# Patient Record
Sex: Female | Born: 1975 | Hispanic: No | Marital: Married | Smoking: Former smoker
Health system: Southern US, Community
[De-identification: ages and names within clinical notes are randomized; demographics above are authoritative.]

## PROBLEM LIST (undated history)

## (undated) DIAGNOSIS — I8390 Asymptomatic varicose veins of unspecified lower extremity: Secondary | ICD-10-CM

## (undated) DIAGNOSIS — D649 Anemia, unspecified: Secondary | ICD-10-CM

## (undated) DIAGNOSIS — R2240 Localized swelling, mass and lump, unspecified lower limb: Secondary | ICD-10-CM

## (undated) HISTORY — DX: Anemia, unspecified: D64.9

## (undated) HISTORY — PX: VEIN LIGATION: SHX2652

## (undated) HISTORY — DX: Localized swelling, mass and lump, unspecified lower limb: R22.40

## (undated) HISTORY — DX: Asymptomatic varicose veins of unspecified lower extremity: I83.90

---

## 2008-12-30 ENCOUNTER — Other Ambulatory Visit: Admission: RE | Admit: 2008-12-30 | Discharge: 2008-12-30 | Payer: Self-pay | Admitting: Family Medicine

## 2009-07-24 ENCOUNTER — Emergency Department (HOSPITAL_COMMUNITY): Admission: EM | Admit: 2009-07-24 | Discharge: 2009-07-24 | Payer: Self-pay | Admitting: Emergency Medicine

## 2010-10-14 ENCOUNTER — Inpatient Hospital Stay (INDEPENDENT_AMBULATORY_CARE_PROVIDER_SITE_OTHER)
Admission: RE | Admit: 2010-10-14 | Discharge: 2010-10-14 | Disposition: A | Discharge status: Home / Self Care | Payer: Self-pay | Source: Ambulatory Visit | Attending: Family Medicine | Admitting: Family Medicine

## 2010-10-14 DIAGNOSIS — N390 Urinary tract infection, site not specified: Secondary | ICD-10-CM

## 2010-10-14 LAB — POCT URINALYSIS DIPSTICK
Ketones, ur: NEGATIVE mg/dL
Protein, ur: NEGATIVE mg/dL
Specific Gravity, Urine: 1.015 (ref 1.005–1.030)
Urine Glucose, Fasting: NEGATIVE mg/dL
Urobilinogen, UA: 0.2 mg/dL (ref 0.0–1.0)

## 2010-10-14 LAB — WET PREP, GENITAL
Trich, Wet Prep: NONE SEEN
Yeast Wet Prep HPF POC: NONE SEEN

## 2010-11-23 LAB — POCT URINALYSIS DIP (DEVICE)
Bilirubin Urine: NEGATIVE
Ketones, ur: NEGATIVE mg/dL
Protein, ur: NEGATIVE mg/dL
pH: 5.5 (ref 5.0–8.0)

## 2011-12-20 ENCOUNTER — Other Ambulatory Visit (HOSPITAL_COMMUNITY)
Admission: RE | Admit: 2011-12-20 | Discharge: 2011-12-20 | Disposition: A | Discharge status: Home / Self Care | Payer: 59 | Source: Ambulatory Visit | Attending: Family Medicine | Admitting: Family Medicine

## 2011-12-20 DIAGNOSIS — Z124 Encounter for screening for malignant neoplasm of cervix: Secondary | ICD-10-CM | POA: Insufficient documentation

## 2012-01-09 ENCOUNTER — Encounter (INDEPENDENT_AMBULATORY_CARE_PROVIDER_SITE_OTHER): Payer: Self-pay

## 2012-01-25 ENCOUNTER — Ambulatory Visit (INDEPENDENT_AMBULATORY_CARE_PROVIDER_SITE_OTHER): Payer: 59 | Admitting: General Surgery

## 2012-02-08 ENCOUNTER — Encounter (INDEPENDENT_AMBULATORY_CARE_PROVIDER_SITE_OTHER): Payer: Self-pay | Admitting: General Surgery

## 2012-02-09 ENCOUNTER — Ambulatory Visit (INDEPENDENT_AMBULATORY_CARE_PROVIDER_SITE_OTHER): Payer: 59 | Admitting: General Surgery

## 2012-02-09 ENCOUNTER — Encounter (INDEPENDENT_AMBULATORY_CARE_PROVIDER_SITE_OTHER): Payer: Self-pay | Admitting: General Surgery

## 2012-02-09 VITALS — BP 116/66 | HR 62 | Temp 96.9°F | Resp 12 | Ht 65.75 in | Wt 138.5 lb

## 2012-02-09 DIAGNOSIS — I8390 Asymptomatic varicose veins of unspecified lower extremity: Secondary | ICD-10-CM | POA: Insufficient documentation

## 2012-02-09 DIAGNOSIS — D1739 Benign lipomatous neoplasm of skin and subcutaneous tissue of other sites: Secondary | ICD-10-CM

## 2012-02-09 DIAGNOSIS — D1720 Benign lipomatous neoplasm of skin and subcutaneous tissue of unspecified limb: Secondary | ICD-10-CM

## 2012-02-09 NOTE — Progress Notes (Signed)
Patient ID: Kayla Hodges, female   DOB: 1975-09-28, 36 y.o.   MRN: [id number]  Chief Complaint  Patient presents with  . Mass    Leg     HPI Kayla Hodges is a 36 y.o. female.   HPI 36 year old Caucasian female referred by  Dr. Jillyn Hidden for evaluation of a left lateral posterior thigh subcutaneous mass. The patient states that it has been there since childhood. She thinks that it has slowly grown over the years. She will occasionally have some lateral posterior left leg pain when running. She denies any trauma to the area. She denies any weight loss. She denies any fevers or chills. She denies any night sweats. She denies any family history of cancer including soft tissue cancer like liposarcoma.   Past Medical History  Diagnosis Date  . Anemia   . Varicose veins   . Leg mass     Past Surgical History  Procedure Date  . Vein ligation 2008 - approximate date    Family History  Problem Relation Age of Onset  . Hypothyroidism Father   . Hyperthyroidism Mother     Social History History  Substance Use Topics  . Smoking status: Former Smoker    Quit date: 02/08/1999  . Smokeless tobacco: Never Used  . Alcohol Use: Yes     socially, occasional    No Known Allergies  Current Outpatient Prescriptions  Medication Sig Dispense Refill  . Acetaminophen (TYLENOL PO) Take by mouth as needed.      . IBUPROFEN PO Take by mouth as needed.        Review of Systems Review of Systems  Constitutional: Negative for fever, chills and unexpected weight change.  HENT: Negative for hearing loss, congestion, sore throat, trouble swallowing and voice change.   Eyes: Negative for visual disturbance.  Respiratory: Negative for apnea, cough, chest tightness, shortness of breath and wheezing.   Cardiovascular: Negative for chest pain, palpitations and leg swelling.  Gastrointestinal: Negative for nausea, vomiting, abdominal pain, diarrhea, constipation, blood in stool, abdominal distention and  anal bleeding.  Genitourinary: Negative for hematuria, vaginal bleeding and difficulty urinating.  Musculoskeletal: Negative for arthralgias.  Skin: Negative for rash and wound.  Neurological: Negative for seizures, syncope and headaches.  Hematological: Negative for adenopathy. Does not bruise/bleed easily.  Psychiatric/Behavioral: Negative for confusion.    Blood pressure 116/66, pulse 62, temperature 96.9 F (36.1 C), temperature source Temporal, resp. rate 12, height 5' 5.75" (1.67 m), weight 138 lb 8 oz (62.823 kg).  Physical Exam Physical Exam  Vitals reviewed. Constitutional: She is oriented to person, place, and time. She appears well-developed and well-nourished. No distress.  HENT:  Head: Normocephalic and atraumatic.  Right Ear: External ear normal.  Left Ear: External ear normal.  Eyes: Conjunctivae are normal.  Neck: Normal range of motion. Neck supple. No tracheal deviation present. No thyromegaly present.  Cardiovascular: Normal rate, regular rhythm and normal heart sounds.   Pulmonary/Chest: Effort normal and breath sounds normal. No respiratory distress. She has no wheezes.  Abdominal: She exhibits no distension.  Musculoskeletal: She exhibits no edema and no tenderness.  Lymphadenopathy:    She has no cervical adenopathy.    She has no axillary adenopathy.  Neurological: She is alert and oriented to person, place, and time. She exhibits normal muscle tone.  Skin: Skin is warm and dry. No rash noted. She is not diaphoretic. No erythema. No pallor.          Left lateral posterior  thigh subcu mass well circumscribed mobile, soft, 4 x4cm, no overlying skin changes  Psychiatric: She has a normal mood and affect. Her behavior is normal. Judgment and thought content normal.    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 The patient was given educational material. We discussed that the majority of lipomas are benign although on a rare occasion it can be malignant.   We discussed observation versus surgical excision. We discussed the risks and benefits of surgery including but not limited to bleeding, infection, injury to surrounding structures, scarring, cosmetic concerns, blood clot formation, anesthesia issues, possible recurrence, and the typical postoperative course.   The patient has elected to proceed to the OR for excision of left lateral posterior thigh subcutaneous mass/lipoma  Mary Sella. Andrey Campanile, MD, FACS General, Bariatric, & Minimally Invasive Surgery [facility], [place]         [facility] M 02/09/2012, 10:25 AM

## 2012-02-09 NOTE — Patient Instructions (Signed)
Lipoma  Your exam shows you have a lipoma. A lipoma is a benign, non-cancerous, tumor that contains fat cells. Lipomas tend to occur right under the skin in certain areas, especially the back, shoulders, buttocks, and thighs. They are the most common soft tissue tumor. Lipomas can usually be felt as soft, non-tender masses. They seldom cause problems and no treatment is needed. Lipomas can be removed surgically if there is any question about the diagnosis, or if they cause any cosmetic problem. See your doctor for further evaluation as recommended.  SEEK MEDICAL CARE IF:    The lipoma becomes larger or hard.   The lipoma becomes painful, red, or increasingly swollen. These could be signs of infection or a more serious condition.  Document Released: 09/15/2004 Document Revised: 07/28/2011 Document Reviewed: 05/13/2008  [employer] Patient Information 2012 [employer].

## 2012-05-03 ENCOUNTER — Encounter (HOSPITAL_BASED_OUTPATIENT_CLINIC_OR_DEPARTMENT_OTHER): Payer: Self-pay

## 2012-05-03 ENCOUNTER — Ambulatory Visit (HOSPITAL_BASED_OUTPATIENT_CLINIC_OR_DEPARTMENT_OTHER): Admit: 2012-05-03 | Payer: Self-pay | Admitting: General Surgery

## 2012-05-03 SURGERY — EXCISION MASS
Anesthesia: Choice | Laterality: Left

## 2015-01-07 ENCOUNTER — Other Ambulatory Visit: Payer: Self-pay | Admitting: Family Medicine

## 2015-01-07 ENCOUNTER — Other Ambulatory Visit (HOSPITAL_COMMUNITY)
Admission: RE | Admit: 2015-01-07 | Discharge: 2015-01-07 | Disposition: A | Discharge status: Home / Self Care | Payer: 59 | Source: Ambulatory Visit | Attending: Family Medicine | Admitting: Family Medicine

## 2015-01-07 DIAGNOSIS — Z01419 Encounter for gynecological examination (general) (routine) without abnormal findings: Secondary | ICD-10-CM | POA: Insufficient documentation

## 2015-01-07 DIAGNOSIS — Z1231 Encounter for screening mammogram for malignant neoplasm of breast: Secondary | ICD-10-CM

## 2015-01-08 LAB — CYTOLOGY - PAP

## 2015-01-14 ENCOUNTER — Other Ambulatory Visit: Payer: Self-pay | Admitting: *Deleted

## 2015-01-14 DIAGNOSIS — I83813 Varicose veins of bilateral lower extremities with pain: Secondary | ICD-10-CM

## 2015-03-12 ENCOUNTER — Encounter: Payer: Self-pay | Admitting: Surgery

## 2015-03-16 ENCOUNTER — Ambulatory Visit (INDEPENDENT_AMBULATORY_CARE_PROVIDER_SITE_OTHER): Payer: 59 | Admitting: Surgery

## 2015-03-16 ENCOUNTER — Ambulatory Visit (HOSPITAL_COMMUNITY)
Admission: RE | Admit: 2015-03-16 | Discharge: 2015-03-16 | Disposition: A | Discharge status: Home / Self Care | Payer: 59 | Source: Ambulatory Visit | Attending: Surgery | Admitting: Surgery

## 2015-03-16 ENCOUNTER — Encounter: Payer: Self-pay | Admitting: Surgery

## 2015-03-16 VITALS — BP 119/77 | HR 67 | Temp 98.4°F | Resp 16 | Ht 65.75 in | Wt 150.0 lb

## 2015-03-16 DIAGNOSIS — I872 Venous insufficiency (chronic) (peripheral): Secondary | ICD-10-CM | POA: Diagnosis not present

## 2015-03-16 DIAGNOSIS — I83813 Varicose veins of bilateral lower extremities with pain: Secondary | ICD-10-CM

## 2015-03-16 NOTE — Progress Notes (Signed)
Patient name: Kayla Hodges MRN: [id number] DOB: May 30, 1976 Sex: female   Referred by: Dr. Chapman Fitch  Reason for referral:  Chief Complaint  Patient presents with  . New Evaluation    Ref. by Dr. Verne Grain  C/O  Bilateral Vericose Veins 10 + yrs and pt.has worn support stockings for years.    HISTORY OF PRESENT ILLNESS:  This is a 39 year old female who comes in today for evaluation of varicose veins. The patient states that she underwent laser ablation of the right great saphenous vein in [place] in 2008. She is now having pain in her varicosities on the right leg.  She also has painful varicosities on the left anterior thigh.  These are associated with bilateral lower extremity edema.  The patient has been wearing 20-30 thigh-high compression stockings.  Despite compression stocking she still has discomfort at her work site , as she is on her feet all day.   She denies a history of DVT. She does have a family history of varicose veins.  Past Medical History  Diagnosis Date  . Anemia   . Varicose veins   . Leg mass     Past Surgical History  Procedure Laterality Date  . Vein ligation  2008 - approximate date    ? Left Leg    History   Social History  . Marital Status: Married    Spouse Name: N/A  . Number of Children: N/A  . Years of Education: N/A   Occupational History  . Not on file.   Social History Main Topics  . Smoking status: Former Smoker    Quit date: 02/08/1999  . Smokeless tobacco: Never Used  . Alcohol Use: Yes     Comment: socially, occasional  . Drug Use: No  . Sexual Activity: Not on file   Other Topics Concern  . Not on file   Social History Narrative    Family History  Problem Relation Age of Onset  . Hypothyroidism Father   . Hyperthyroidism Mother     Allergies as of 03/16/2015  . (No Known Allergies)    Current Outpatient Prescriptions on File Prior to Visit  Medication Sig Dispense Refill  . Acetaminophen (TYLENOL PO) Take by  mouth as needed.    . IBUPROFEN PO Take by mouth as needed.     No current facility-administered medications on file prior to visit.     REVIEW OF SYSTEMS: Cardiovascular: No chest pain, chest pressure, palpitations, orthopnea, or dyspnea on exertion. No claudication or rest pain,  No history of DVT or phlebitis. Positive for varicose veins Pulmonary: No productive cough, asthma or wheezing. Neurologic: No weakness, paresthesias, aphasia, or amaurosis. No dizziness. Hematologic: No bleeding problems or clotting disorders. Musculoskeletal: No joint pain or joint swelling. Gastrointestinal: No blood in stool or hematemesis Genitourinary: No dysuria or hematuria. Psychiatric:: No history of major depression. Integumentary: No rashes or ulcers. Constitutional: No fever or chills.  PHYSICAL EXAMINATION:  Filed Vitals:   03/16/15 1403  BP: 119/77  Pulse: 67  Temp: 98.4 F (36.9 C)  TempSrc: Oral  Resp: 16  Height: 5' 5.75" (1.67 m)  Weight: 150 lb (68.04 kg)  SpO2: 99%   Body mass index is 24.4 kg/(m^2). General: The patient appears their stated age.   HEENT:  No gross abnormalities Pulmonary: Respirations are non-labored Abdomen: Soft and non-tender  Musculoskeletal: There are no major deformities.   Neurologic: No focal weakness or paresthesias are detected, Skin: There are no ulcer  or rashes noted. Psychiatric: The patient has normal affect. Cardiovascular: There is a regular rate and rhythm without significant murmur appreciated. Prominent painful varicosities on the medial right leg at the level of the knee.  Painful prominent varicosities on the upper left anterior thigh. Bilateral 1+ pitting edema  Diagnostic Studies:  I have ordered and reviewed her ultrasound studies. There is no evidence of DVT. On the left leg there is reflux within the great saphenous vein with maximum diameter of 0.9 cm. The right great saphenous vein is not visualized.   Assessment:   chronic  venous insufficiency Plan:  the patient suffers from chronic venous insufficiency.  She is undergone laser ablation of the right great saphenous vein and any years ago.  She has been wearing 20-30 thigh-high compression stockings for many years but still has breakthrough symptoms as she is on her feet all day at work. Her biggest complaints are that of pain in her right leg varicosities which is not alleviated with conservative  Therapies.  She also has pain in her left leg varicosities.  Both legs are associated with edema which gets worse with prolonged standing and  Effects her activities at work.   based on the patient's physical examination, vascular lab studies , and clinical history I would propose proceeding with endovenous laser ablation of the left great saphenous vein with 10-20 stab phlebectomy's and 2 courses of sclerotherapy. On the right to treat her painful prominent varicosities , this would require 10-20 stab phlebectomy's and 2 courses of sclerotherapy.  We will be in contact with the patient for  Scheduling.    Eldridge Abrahams, M.D. [facility] Office: [phone] Pager:  [phone]

## 2015-03-24 ENCOUNTER — Other Ambulatory Visit: Payer: Self-pay | Admitting: *Deleted

## 2015-03-24 DIAGNOSIS — I83893 Varicose veins of bilateral lower extremities with other complications: Secondary | ICD-10-CM

## 2015-04-10 ENCOUNTER — Encounter: Payer: Self-pay | Admitting: Vascular Surgery

## 2015-04-13 ENCOUNTER — Ambulatory Visit (INDEPENDENT_AMBULATORY_CARE_PROVIDER_SITE_OTHER): Payer: 59 | Admitting: Vascular Surgery

## 2015-04-13 ENCOUNTER — Encounter: Payer: Self-pay | Admitting: Vascular Surgery

## 2015-04-13 VITALS — BP 128/82 | HR 67 | Temp 87.4°F | Resp 16 | Ht 66.0 in | Wt 150.0 lb

## 2015-04-13 DIAGNOSIS — I83892 Varicose veins of left lower extremities with other complications: Secondary | ICD-10-CM

## 2015-04-13 NOTE — Progress Notes (Signed)
Laser Ablation Procedure    Date: 04/13/2015   Kayla Hodges DOB:12-21-1975  Consent signed: Yes    Surgeon:  Dr. Nelda Severe. Kellie Simmering  Procedure: Laser Ablation: left Greater Saphenous Vein  BP 128/82 mmHg  Pulse 67  Temp(Src) 87.4 F (30.8 C)  Resp 16  Ht 5\' 6"  (1.676 m)  Wt 150 lb (68.04 kg)  BMI 24.22 kg/m2  SpO2 100%  Tumescent Anesthesia: 350 cc 0.9% NaCl with 50 cc Lidocaine HCL with 1% Epi and 15 cc 8.4% NaHCO3  Local Anesthesia: 7 cc Lidocaine HCL and NaHCO3 (ratio 2:1)  Pulsed Mode: 15 watts, 575ms delay, 1.0 duration  Total Energy:1332              Total Pulses:    90            Total Time: 1:29   Stab Phlebectomy: 1-0-20 Sites: Thigh and Calf  Patient tolerated procedure well  Notes:   Description of Procedure:  After marking the course of the secondary varicosities, the patient was placed on the operating table in the supine position, and the left leg was prepped and draped in sterile fashion.   Local anesthetic was administered and under ultrasound guidance the saphenous vein was accessed with a micro needle and guide wire; then the mirco puncture sheath was placed.  A guide wire was inserted saphenofemoral junction , followed by a 5 french sheath.  The position of the sheath and then the laser fiber below the junction was confirmed using the ultrasound.  Tumescent anesthesia was administered along the course of the saphenous vein using ultrasound guidance. The patient was placed in Trendelenburg position and protective laser glasses were placed on patient and staff, and the laser was fired at 15 watts continuous mode advancing 1-88mm/second for a total of 1332 joules.   For stab phlebectomies, local anesthetic was administered at the previously marked varicosities, and tumescent anesthesia was administered around the vessels.  Ten to 20 stab wounds were made using the tip of an 11 blade. And using the vein hook, the phlebectomies were performed using a hemostat to avulse  the varicosities.  Adequate hemostasis was achieved.     Steri strips were applied to the stab wounds and ABD pads and thigh high compression stockings were applied.  Ace wrap bandages were applied over the phlebectomy sites and at the top of the saphenofemoral junction. Blood loss was less than 15 cc.  The patient ambulated out of the operating room having tolerated the procedure well.

## 2015-04-13 NOTE — Progress Notes (Signed)
Subjective:     Patient ID: Kayla Hodges, female   DOB: 03-02-1976, 39 y.o.   MRN: [id number]  HPI this 39 year old female had laser ablation of the left great saphenous vein from the distal thigh to near the saphenofemoral junction plus multiple stab phlebectomy of painful varicosities. A total of 1332 J of energy was utilized. She tolerated the procedure well.   Review of Systems     Objective:   Physical Exam BP 128/82 mmHg  Pulse 67  Temp(Src) 87.4 F (30.8 C)  Resp 16  Ht 5\' 6"  (1.676 m)  Wt 150 lb (68.04 kg)  BMI 24.22 kg/m2  SpO2 100%       Assessment:     Well-tolerated laser ablation left great saphenous vein with multiple stab phlebectomy (10-20) painful varicosities performed under local tumescent anesthesia    Plan:     Return in 1 week for venous duplex exam to confirm closure left great saphenous vein

## 2015-04-14 ENCOUNTER — Telehealth: Payer: Self-pay | Admitting: *Deleted

## 2015-04-14 ENCOUNTER — Encounter: Payer: Self-pay | Admitting: Vascular Surgery

## 2015-04-14 NOTE — Telephone Encounter (Signed)
Pt doing well. No bleeding or problems. Following all instructions

## 2015-04-17 ENCOUNTER — Encounter: Payer: Self-pay | Admitting: Vascular Surgery

## 2015-04-20 ENCOUNTER — Ambulatory Visit (INDEPENDENT_AMBULATORY_CARE_PROVIDER_SITE_OTHER): Payer: 59 | Admitting: Vascular Surgery

## 2015-04-20 ENCOUNTER — Encounter: Payer: Self-pay | Admitting: Vascular Surgery

## 2015-04-20 ENCOUNTER — Ambulatory Visit (HOSPITAL_COMMUNITY)
Admission: RE | Admit: 2015-04-20 | Discharge: 2015-04-20 | Disposition: A | Discharge status: Home / Self Care | Payer: 59 | Source: Ambulatory Visit | Attending: Vascular Surgery | Admitting: Vascular Surgery

## 2015-04-20 VITALS — BP 115/74 | HR 85 | Temp 98.4°F | Resp 14 | Ht 66.0 in | Wt 150.0 lb

## 2015-04-20 DIAGNOSIS — I83892 Varicose veins of left lower extremities with other complications: Secondary | ICD-10-CM

## 2015-04-20 DIAGNOSIS — I83893 Varicose veins of bilateral lower extremities with other complications: Secondary | ICD-10-CM | POA: Diagnosis not present

## 2015-04-20 NOTE — Progress Notes (Signed)
Subjective:     Patient ID: Kayla Hodges, female   DOB: February 25, 1976, 39 y.o.   MRN: [id number]  HPI this 39 year old female returns 1 week post-laser ablation left great saphenous vein with multiple stab phlebectomy of painful varicosities. She has had mild to moderate discomfort in the left medial thigh. She denies any distal edema. She has worn elastic compression stocking and taken ibuprofen as instructed.   Review of Systems     Objective:   Physical Exam BP 115/74 mmHg  Pulse 85  Temp(Src) 98.4 F (36.9 C)  Resp 14  Ht 5\' 6"  (1.676 m)  Wt 150 lb (68.04 kg)  BMI 24.22 kg/m2  Gen. well-developed well-nourished female in no apparent distress alert and oriented 3 Lungs no rhonchi or wheezing Left leg with mild to moderate ecchymosis in mid distal thigh with mild tenderness to palpation. Stab phlebectomy sites are all healing nicely. 3+ to Sallis pedis pulse palpable with no distal edema.  Today I ordered a venous duplex exam of the left leg which I reviewed and interpreted. There is no DVT. There is total closure of the great saphenous vein from the distal thigh to near the saphenofemoral junction.     Assessment:     Successful laser ablation left great saphenous vein with multiple stab phlebectomy of painful varicosities    Plan:     Return September 15 for multiple stab phlebectomy of painful varicosities right leg

## 2015-05-05 ENCOUNTER — Encounter: Payer: Self-pay | Admitting: Vascular Surgery

## 2015-05-07 ENCOUNTER — Encounter: Payer: Self-pay | Admitting: Vascular Surgery

## 2015-05-07 ENCOUNTER — Ambulatory Visit (INDEPENDENT_AMBULATORY_CARE_PROVIDER_SITE_OTHER): Payer: 59 | Admitting: Vascular Surgery

## 2015-05-07 VITALS — BP 128/84 | HR 71 | Temp 97.8°F | Resp 16 | Ht 66.0 in | Wt 150.0 lb

## 2015-05-07 DIAGNOSIS — I83891 Varicose veins of right lower extremities with other complications: Secondary | ICD-10-CM

## 2015-05-07 NOTE — Progress Notes (Signed)
    Stab Phlebectomy Procedure  Kayla Hodges DOB:08/31/1975  05/07/2015  Consent signed: Yes  Surgeon:J.D. Kellie Simmering  Procedure: stab phlebectomy: right leg  BP 128/84 mmHg  Pulse 71  Temp(Src) 97.8 F (36.6 C)  Resp 16  Ht 5\' 6"  (1.676 m)  Wt 150 lb (68.04 kg)  BMI 24.22 kg/m2  SpO2 100%  Start time: 8:35   End time: 9:15   Tumescent Anesthesia: 180 cc 0.9% NaCl with 50 cc Lidocaine HCL with 1% Epi and 15 cc 8.4% NaHCO3  Local Anesthesia: 3 cc Lidocaine HCL and NaHCO3 (ratio 2:1)    Stab Phlebectomy: 10-20 Sites: Thigh and Calf  Patient tolerated procedure well: Yes  Notes:   Description of Procedure:  After marking the course of the secondary varicosities, the patient was placed on the operating table in the supine position, and the right leg was prepped and draped in sterile fashion.    The patient was then put into Trendelenburg position.  Local anesthetic was administered at the previously marked varicosities, and tumescent anesthesia was administered around the vessels.  Ten to 20 stab wounds were made using the tip of an 11 blade. And using the vein hook, the phlebectomies were performed using a hemostat to avulse the varicosities.  Adequate hemostasis was achieved, and steri strips were applied to the stab wound.      ABD pads and thigh high compression stockings were applied as well ace wraps where needed. Blood loss was less than 15 cc.  The patient ambulated out of the operating room having tolerated the procedure well.

## 2015-05-07 NOTE — Progress Notes (Signed)
Subjective:     Patient ID: Kayla Hodges, female   DOB: 27-Nov-1975, 39 y.o.   MRN: [id number]  HPI this 39 year old female had multiple stab phlebectomy of painful varicosities in the right leg performed under local tumescent anesthesia. She tolerated the procedure well.   Review of Systems     Objective:   Physical Exam BP 128/84 mmHg  Pulse 71  Temp(Src) 97.8 F (36.6 C)  Resp 16  Ht 5\' 6"  (1.676 m)  Wt 150 lb (68.04 kg)  BMI 24.22 kg/m2  SpO2 100%  2     Assessment:     Well-tolerated stab phlebectomy-tender 20-of painful varicosities right leg performed under local tumescent anesthesia    Plan:     Return in 2 months for final follow-up

## 2015-07-02 ENCOUNTER — Encounter: Payer: Self-pay | Admitting: Vascular Surgery

## 2015-07-07 ENCOUNTER — Ambulatory Visit (INDEPENDENT_AMBULATORY_CARE_PROVIDER_SITE_OTHER): Payer: 59 | Admitting: Vascular Surgery

## 2015-07-07 ENCOUNTER — Encounter: Payer: Self-pay | Admitting: Vascular Surgery

## 2015-07-07 VITALS — BP 111/72 | HR 78 | Temp 97.7°F | Resp 14 | Ht 66.0 in | Wt 151.0 lb

## 2015-07-07 DIAGNOSIS — I83893 Varicose veins of bilateral lower extremities with other complications: Secondary | ICD-10-CM

## 2015-07-07 NOTE — Progress Notes (Signed)
Subjective:     Patient ID: Kayla Hodges, female   DOB: June 21, 1976, 39 y.o.   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 She had laser ablation of left great saphenous with multiple stab phlebectomy followed by multiple stab phlebectomy of painful varicosities right leg. She denies any discomfort or swelling. She is pleased with her result. She is not wearing elastic compression stockings present time and has no distal edema.   Review of Systems     Objective:   Physical Exam BP 111/72 mmHg  Pulse 78  Temp(Src) 97.7 F (36.5 C) (Oral)  Resp 14  Ht 5\' 6"  (1.676 m)  Wt 151 lb (68.493 kg)  BMI 24.38 kg/m2  SpO2 97%  LMP 07/03/2015  Gen. well-developed well-nourished female in no apparent distress alert and oriented 3 Both lower extremities are free of any obvious bulging varicosities. No distal edema noted. 3+ dorsalis pedis pulse palpable.     Assessment:     Good result following laser ablation left great saphenous vein with multiple stab phlebectomy followed by multiple stab phlebectomy of painful varicosities in right leg    Plan:     Will return to see me on when necessary basis

## 2015-08-12 ENCOUNTER — Ambulatory Visit: Payer: 59

## 2015-09-07 ENCOUNTER — Ambulatory Visit
Admission: RE | Admit: 2015-09-07 | Discharge: 2015-09-07 | Disposition: A | Discharge status: Home / Self Care | Payer: 59 | Source: Ambulatory Visit | Attending: Family Medicine | Admitting: Family Medicine

## 2015-09-07 DIAGNOSIS — Z1231 Encounter for screening mammogram for malignant neoplasm of breast: Secondary | ICD-10-CM

## 2016-02-09 MED FILL — DRYSOL SOLUTION: 20 | 30 days supply | Qty: 38 | Fill #0

## 2016-05-10 MED FILL — TRIAMCINOLONE 0.1% CREAM: 0.1 | 30 days supply | Qty: 45 | Fill #0

## 2016-06-27 ENCOUNTER — Ambulatory Visit: Payer: Self-pay | Admitting: Surgery

## 2016-06-27 NOTE — H&P (Signed)
History of Present Illness Kayla Hodges. Crew Goren MD; 06/27/2016 10:33 AM) The patient is a 40 year old female who presents with a complaint of Mass. Referred by Dr. Chapman Fitch for evaluation of a left lateral posterior thigh subcutaneous mass. The patient states that it has been there since childhood. She thinks that it has slowly grown over the years. She will occasionally have some lateral posterior left leg pain when running. She denies any trauma to the area. She denies any weight loss. She denies any fevers or chills. She denies any night sweats. She denies any family history of cancer including soft tissue cancer like liposarcoma.  The discomfort seems to be increasing. She would like to have this mass removed.   Other Problems Nance Pear, [place]; 06/27/2016 9:49 AM) Other disease, cancer, significant illness Vascular Disease  Diagnostic Studies History Nance Pear, [place]; 06/27/2016 9:49 AM) Colonoscopy never Mammogram within last year  Allergies Nance Pear, CMA; 06/27/2016 9:49 AM) No Known Drug Allergies 06/27/2016  Medication History Nance Pear, CMA; 06/27/2016 9:53 AM) Drysol (20% Solution, External) Active. Tylenol (325MG  Tablet, Oral) Active. Dapsone (5% Gel, External) Active. Ibuprofen (100MG  Tablet, Oral) Active. Multivitamin Adult (Oral) Active. Tazorac (0.05% Cream, External) Active. Medications Reconciled  Social History Nance Pear, [place]; 06/27/2016 9:49 AM) Alcohol use Occasional alcohol use. Caffeine use Coffee. No drug use Tobacco use Former smoker.  Family History Nance Pear, [place]; 06/27/2016 9:49 AM) Thyroid problems Mother, Sister.  Pregnancy / Birth History Nance Pear, [place]; 06/27/2016 9:49 AM) Age at menarche 67 years. Gravida 2 Length (months) of breastfeeding >24 Maternal age 63-30 Para 2 Regular periods     Review of Systems Nance Pear CMA; 06/27/2016 9:49 AM) General Not Present- Appetite Loss, Chills, Fatigue,  Fever, Night Sweats, Weight Gain and Weight Loss. Skin Not Present- Change in Wart/Mole, Dryness, Hives, Jaundice, New Lesions, Non-Healing Wounds, Rash and Ulcer. HEENT Not Present- Earache, Hearing Loss, Hoarseness, Nose Bleed, Oral Ulcers, Ringing in the Ears, Seasonal Allergies, Sinus Pain, Sore Throat, Visual Disturbances, Wears glasses/contact lenses and Yellow Eyes. Respiratory Not Present- Bloody sputum, Chronic Cough, Difficulty Breathing, Snoring and Wheezing. Breast Not Present- Breast Mass, Breast Pain, Nipple Discharge and Skin Changes. Cardiovascular Not Present- Chest Pain, Difficulty Breathing Lying Down, Leg Cramps, Palpitations, Rapid Heart Rate, Shortness of Breath and Swelling of Extremities. Gastrointestinal Not Present- Abdominal Pain, Bloating, Bloody Stool, Change in Bowel Habits, Chronic diarrhea, Constipation, Difficulty Swallowing, Excessive gas, Gets full quickly at meals, Hemorrhoids, Indigestion, Nausea, Rectal Pain and Vomiting. Female Genitourinary Not Present- Frequency, Nocturia, Painful Urination, Pelvic Pain and Urgency. Musculoskeletal Not Present- Back Pain, Joint Pain, Joint Stiffness, Muscle Pain, Muscle Weakness and Swelling of Extremities. Neurological Not Present- Decreased Memory, Fainting, Headaches, Numbness, Seizures, Tingling, Tremor, Trouble walking and Weakness. Psychiatric Not Present- Anxiety, Bipolar, Change in Sleep Pattern, Depression, Fearful and Frequent crying. Endocrine Not Present- Cold Intolerance, Excessive Hunger, Hair Changes, Heat Intolerance, Hot flashes and New Diabetes. Hematology Not Present- Blood Thinners, Easy Bruising, Excessive bleeding, Gland problems, HIV and Persistent Infections.  Vitals Bary Castilla Bradford CMA; 06/27/2016 9:53 AM) 06/27/2016 9:53 AM Weight: 142.2 lb Height: 66in Body Surface Area: 1.73 m Body Mass Index: 22.95 kg/m  Temp.: 98.19F  Pulse: 78 (Regular)  BP: 118/82 (Sitting, Left Arm,  Standard)      Physical Exam Rodman Key K. Izayah Miner MD; 06/27/2016 10:34 AM)  The physical exam findings are as follows: Note:WDWN in NAD Eyes: Pupils equal, round; sclera anicteric HENT: Oral mucosa moist; good dentition Neck: No masses palpated, no thyromegaly  Lungs: CTA bilaterally; normal respiratory effort CV: Regular rate and rhythm; no murmurs; extremities well-perfused with no edema Abd: +bowel sounds, soft, non-tender, no palpable organomegaly; no palpable hernias Left posterior thigh - protruding 5 cm subcutaneous mass, firm, smooth, mobile; no overlying skin changes Skin: Warm, dry; no sign of jaundice Psychiatric - alert and oriented x 4; calm mood and affect    Assessment & Plan Rodman Key K. 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 The surgical procedure has been discussed with the patient. Potential risks, benefits, alternative treatments, and expected outcomes have been explained. All of the patient's questions at this time have been answered. The likelihood of reaching the patient's treatment goal is good. The patient understand the proposed surgical procedure and wishes to proceed.  Kayla Hodges. Georgette Dover, MD, [facility] Surgery  General/ Trauma Surgery  06/27/2016 10:34 AM

## 2016-10-03 MED FILL — DRYSOL DAB-O-MATIC SOLUTION: 20 | 30 days supply | Qty: 35 | Fill #0

## 2016-10-03 MED FILL — PREVIDENT 5000 BOOSTER PLUS: 1.1 | 30 days supply | Qty: 100 | Fill #0

## 2016-10-18 DIAGNOSIS — H5213 Myopia, bilateral: Secondary | ICD-10-CM | POA: Diagnosis not present

## 2016-11-22 ENCOUNTER — Other Ambulatory Visit: Payer: Self-pay | Admitting: Family

## 2016-11-22 DIAGNOSIS — Z1231 Encounter for screening mammogram for malignant neoplasm of breast: Secondary | ICD-10-CM

## 2016-12-16 ENCOUNTER — Ambulatory Visit: Payer: 59

## 2016-12-30 ENCOUNTER — Ambulatory Visit
Admission: RE | Admit: 2016-12-30 | Discharge: 2016-12-30 | Disposition: A | Discharge status: Home / Self Care | Payer: 59 | Source: Ambulatory Visit | Attending: Family | Admitting: Family

## 2016-12-30 DIAGNOSIS — Z1231 Encounter for screening mammogram for malignant neoplasm of breast: Secondary | ICD-10-CM

## 2017-02-12 DIAGNOSIS — S83242A Other tear of medial meniscus, current injury, left knee, initial encounter: Secondary | ICD-10-CM | POA: Diagnosis not present

## 2017-02-14 DIAGNOSIS — Z136 Encounter for screening for cardiovascular disorders: Secondary | ICD-10-CM | POA: Diagnosis not present

## 2017-02-14 DIAGNOSIS — M25562 Pain in left knee: Secondary | ICD-10-CM | POA: Diagnosis not present

## 2017-02-14 DIAGNOSIS — R61 Generalized hyperhidrosis: Secondary | ICD-10-CM | POA: Diagnosis not present

## 2017-02-14 DIAGNOSIS — Z0000 Encounter for general adult medical examination without abnormal findings: Secondary | ICD-10-CM | POA: Diagnosis not present

## 2017-02-14 DIAGNOSIS — Z131 Encounter for screening for diabetes mellitus: Secondary | ICD-10-CM | POA: Diagnosis not present

## 2017-02-14 DIAGNOSIS — Z1329 Encounter for screening for other suspected endocrine disorder: Secondary | ICD-10-CM | POA: Diagnosis not present

## 2017-02-14 MED FILL — TRIAMCINOLONE 0.1% CREAM: 0.1 | 30 days supply | Qty: 45 | Fill #0

## 2017-02-14 MED FILL — DRYSOL SOLUTION: 20 | 30 days supply | Qty: 38 | Fill #0

## 2017-05-26 DIAGNOSIS — Z23 Encounter for immunization: Secondary | ICD-10-CM | POA: Diagnosis not present

## 2017-07-20 MED FILL — DRYSOL SOLUTION: 20 | 30 days supply | Qty: 38 | Fill #1

## 2017-10-06 MED FILL — OSELTAMIVIR PHOSPHATE 75 MG: 75 | 5 days supply | Qty: 10 | Fill #0

## 2017-11-24 DIAGNOSIS — B351 Tinea unguium: Secondary | ICD-10-CM | POA: Diagnosis not present

## 2017-12-21 ENCOUNTER — Other Ambulatory Visit: Payer: Self-pay | Admitting: Internal Medicine

## 2017-12-21 DIAGNOSIS — Z1231 Encounter for screening mammogram for malignant neoplasm of breast: Secondary | ICD-10-CM

## 2018-01-05 DIAGNOSIS — B351 Tinea unguium: Secondary | ICD-10-CM | POA: Diagnosis not present

## 2018-01-12 ENCOUNTER — Ambulatory Visit
Admission: RE | Admit: 2018-01-12 | Discharge: 2018-01-12 | Disposition: A | Discharge status: Home / Self Care | Payer: 59 | Source: Ambulatory Visit | Attending: Internal Medicine | Admitting: Internal Medicine

## 2018-01-12 DIAGNOSIS — Z1231 Encounter for screening mammogram for malignant neoplasm of breast: Secondary | ICD-10-CM | POA: Diagnosis not present

## 2018-05-08 DIAGNOSIS — Z23 Encounter for immunization: Secondary | ICD-10-CM | POA: Diagnosis not present

## 2018-07-27 DIAGNOSIS — Z0000 Encounter for general adult medical examination without abnormal findings: Secondary | ICD-10-CM | POA: Diagnosis not present

## 2018-07-27 DIAGNOSIS — Z13220 Encounter for screening for lipoid disorders: Secondary | ICD-10-CM | POA: Diagnosis not present

## 2018-07-27 DIAGNOSIS — Z131 Encounter for screening for diabetes mellitus: Secondary | ICD-10-CM | POA: Diagnosis not present

## 2018-07-27 DIAGNOSIS — Z124 Encounter for screening for malignant neoplasm of cervix: Secondary | ICD-10-CM | POA: Diagnosis not present

## 2018-08-13 MED FILL — DRYSOL DAB-O-MATIC SOLUTION: 20 | 30 days supply | Qty: 35 | Fill #0

## 2018-10-26 DIAGNOSIS — B351 Tinea unguium: Secondary | ICD-10-CM | POA: Diagnosis not present

## 2018-12-10 ENCOUNTER — Other Ambulatory Visit: Payer: Self-pay | Admitting: Internal Medicine

## 2018-12-10 DIAGNOSIS — Z1231 Encounter for screening mammogram for malignant neoplasm of breast: Secondary | ICD-10-CM

## 2019-02-08 ENCOUNTER — Other Ambulatory Visit: Payer: Self-pay

## 2019-02-08 ENCOUNTER — Ambulatory Visit
Admission: RE | Admit: 2019-02-08 | Discharge: 2019-02-08 | Disposition: A | Discharge status: Home / Self Care | Payer: 59 | Source: Ambulatory Visit | Attending: Internal Medicine | Admitting: Internal Medicine

## 2019-02-08 DIAGNOSIS — Z1231 Encounter for screening mammogram for malignant neoplasm of breast: Secondary | ICD-10-CM

## 2020-01-01 ENCOUNTER — Other Ambulatory Visit: Payer: Self-pay | Admitting: Internal Medicine

## 2020-01-01 DIAGNOSIS — Z1231 Encounter for screening mammogram for malignant neoplasm of breast: Secondary | ICD-10-CM

## 2020-02-21 ENCOUNTER — Other Ambulatory Visit: Payer: Self-pay

## 2020-02-21 ENCOUNTER — Other Ambulatory Visit: Payer: Self-pay | Admitting: Family Medicine

## 2020-02-21 ENCOUNTER — Ambulatory Visit
Admission: RE | Admit: 2020-02-21 | Discharge: 2020-02-21 | Disposition: A | Discharge status: Home / Self Care | Payer: 59 | Source: Ambulatory Visit | Attending: Internal Medicine | Admitting: Internal Medicine

## 2020-02-21 DIAGNOSIS — Z1231 Encounter for screening mammogram for malignant neoplasm of breast: Secondary | ICD-10-CM

## 2020-02-26 ENCOUNTER — Other Ambulatory Visit: Payer: Self-pay | Admitting: Family Medicine

## 2020-02-26 DIAGNOSIS — R928 Other abnormal and inconclusive findings on diagnostic imaging of breast: Secondary | ICD-10-CM

## 2020-03-10 ENCOUNTER — Ambulatory Visit: Payer: 59

## 2020-03-10 ENCOUNTER — Other Ambulatory Visit: Payer: Self-pay

## 2020-03-10 ENCOUNTER — Ambulatory Visit
Admission: RE | Admit: 2020-03-10 | Discharge: 2020-03-10 | Disposition: A | Discharge status: Home / Self Care | Payer: 59 | Source: Ambulatory Visit | Attending: Family Medicine | Admitting: Family Medicine

## 2020-03-10 DIAGNOSIS — R928 Other abnormal and inconclusive findings on diagnostic imaging of breast: Secondary | ICD-10-CM

## 2021-02-18 ENCOUNTER — Other Ambulatory Visit: Payer: Self-pay | Admitting: Family Medicine

## 2021-02-18 DIAGNOSIS — Z1231 Encounter for screening mammogram for malignant neoplasm of breast: Secondary | ICD-10-CM

## 2021-04-16 ENCOUNTER — Ambulatory Visit
Admission: RE | Admit: 2021-04-16 | Discharge: 2021-04-16 | Disposition: A | Discharge status: Home / Self Care | Payer: 59 | Source: Ambulatory Visit | Attending: Family Medicine | Admitting: Family Medicine

## 2021-04-16 ENCOUNTER — Other Ambulatory Visit: Payer: Self-pay

## 2021-04-16 DIAGNOSIS — Z1231 Encounter for screening mammogram for malignant neoplasm of breast: Secondary | ICD-10-CM

## 2021-04-23 ENCOUNTER — Other Ambulatory Visit: Payer: Self-pay | Admitting: Family Medicine

## 2021-04-23 DIAGNOSIS — R928 Other abnormal and inconclusive findings on diagnostic imaging of breast: Secondary | ICD-10-CM

## 2021-05-14 ENCOUNTER — Other Ambulatory Visit: Payer: Self-pay

## 2021-05-14 ENCOUNTER — Ambulatory Visit
Admission: RE | Admit: 2021-05-14 | Discharge: 2021-05-14 | Disposition: A | Discharge status: Home / Self Care | Payer: 59 | Source: Ambulatory Visit | Attending: Family Medicine | Admitting: Family Medicine

## 2021-05-14 DIAGNOSIS — R928 Other abnormal and inconclusive findings on diagnostic imaging of breast: Secondary | ICD-10-CM

## 2022-01-09 IMAGING — MG MM DIGITAL DIAGNOSTIC UNILAT*L* W/ TOMO W/ CAD
4 series · 4 of 12 positions shown · non-contrast
Comparison: Previous exam(s).

CLINICAL DATA: Screening recall for a left breast asymmetry.

EXAM:
DIGITAL DIAGNOSTIC UNILATERAL LEFT MAMMOGRAM WITH TOMOSYNTHESIS AND
CAD; ULTRASOUND LEFT BREAST LIMITED
TECHNIQUE: Left digital diagnostic mammography and breast tomosynthesis was
performed. The images were evaluated with computer-aided detection.;
Targeted ultrasound examination of the left breast was performed.

[L MLO synth-2D]
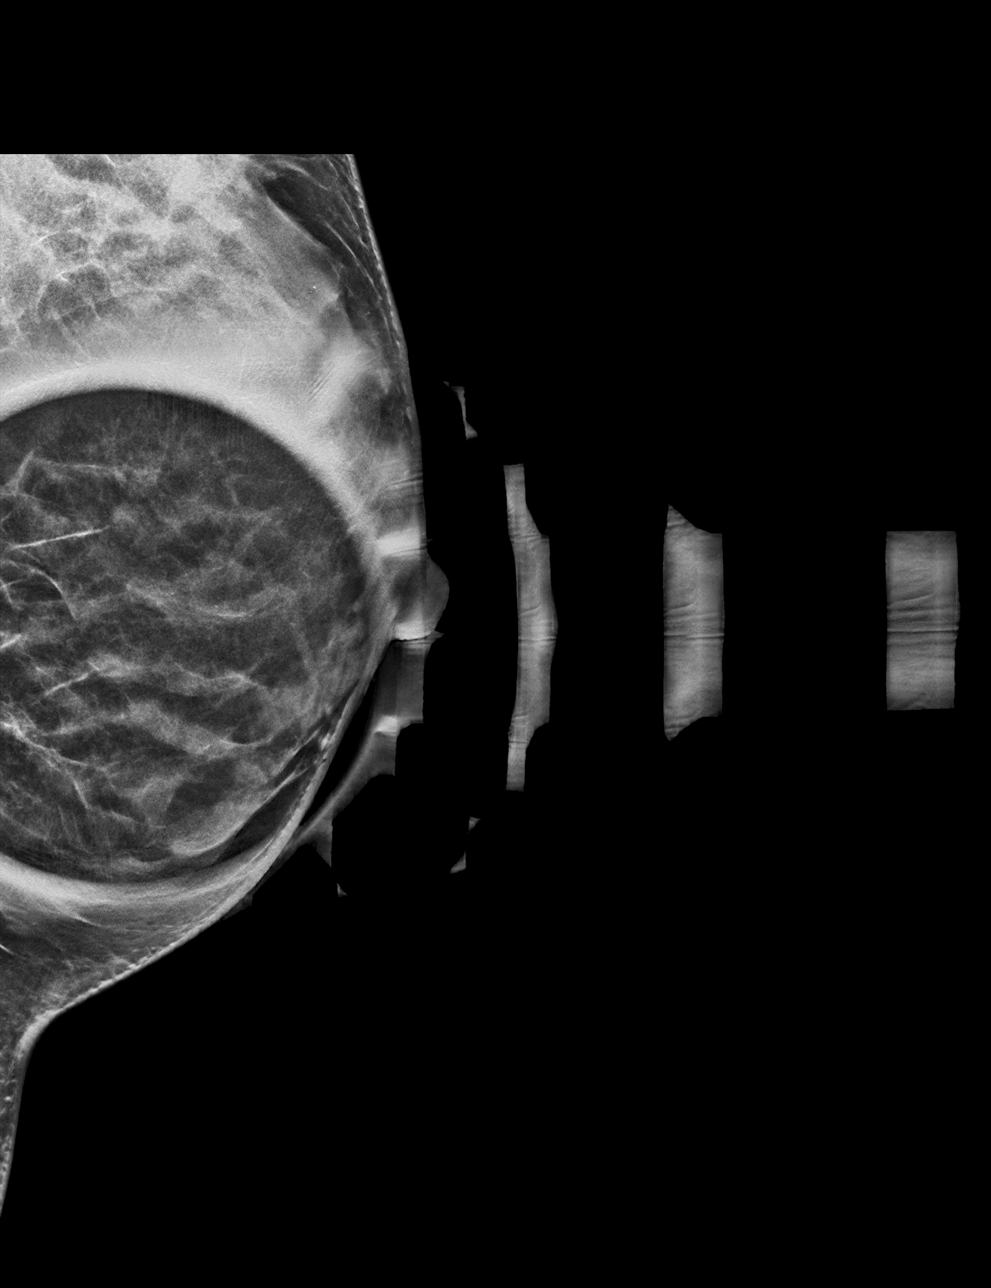

[L ML synth-2D]
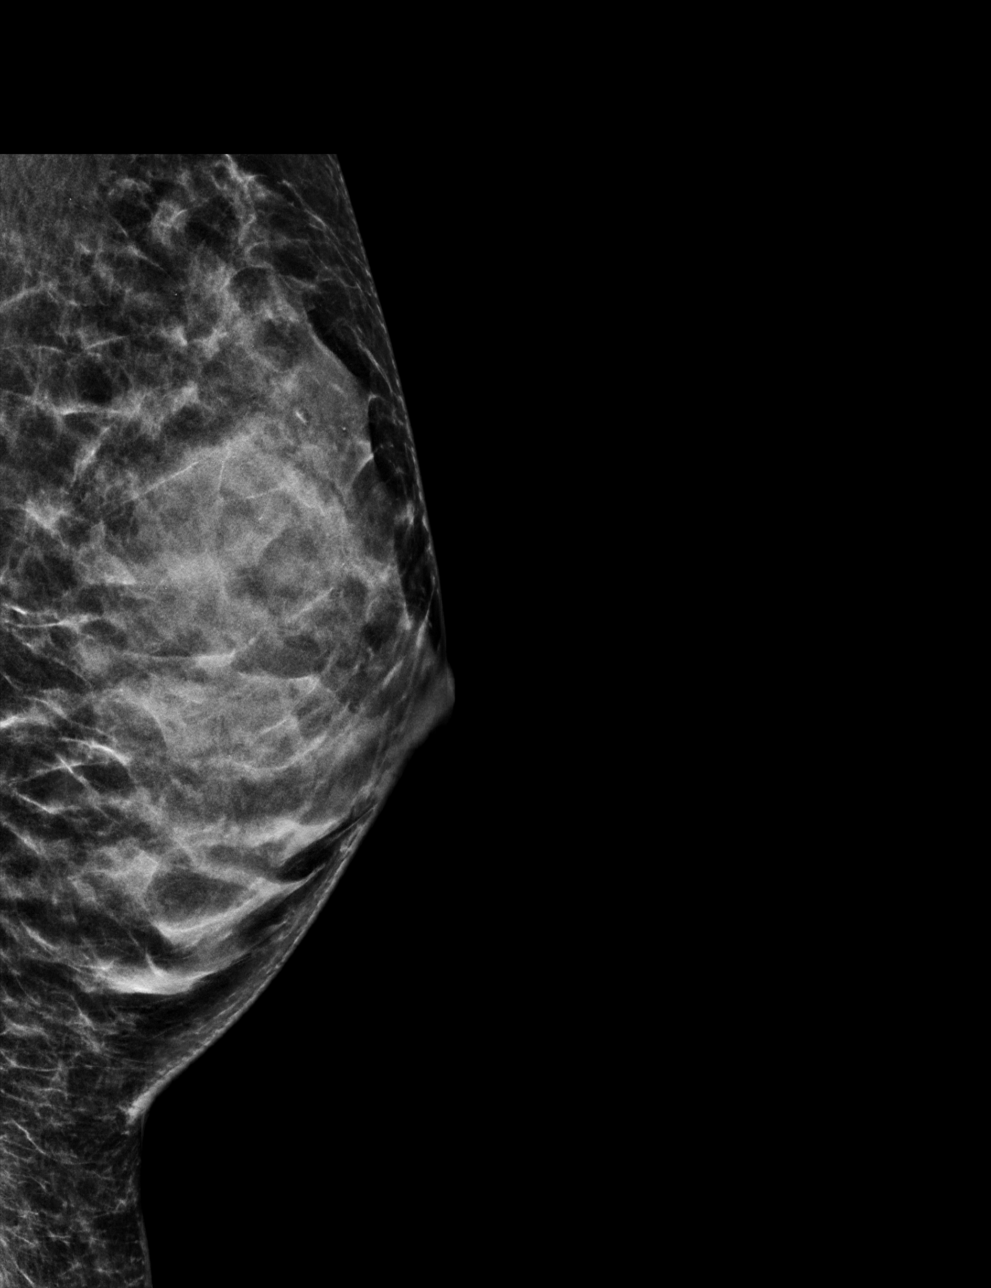

[L ML tomo · tomo slice 25/50.0]
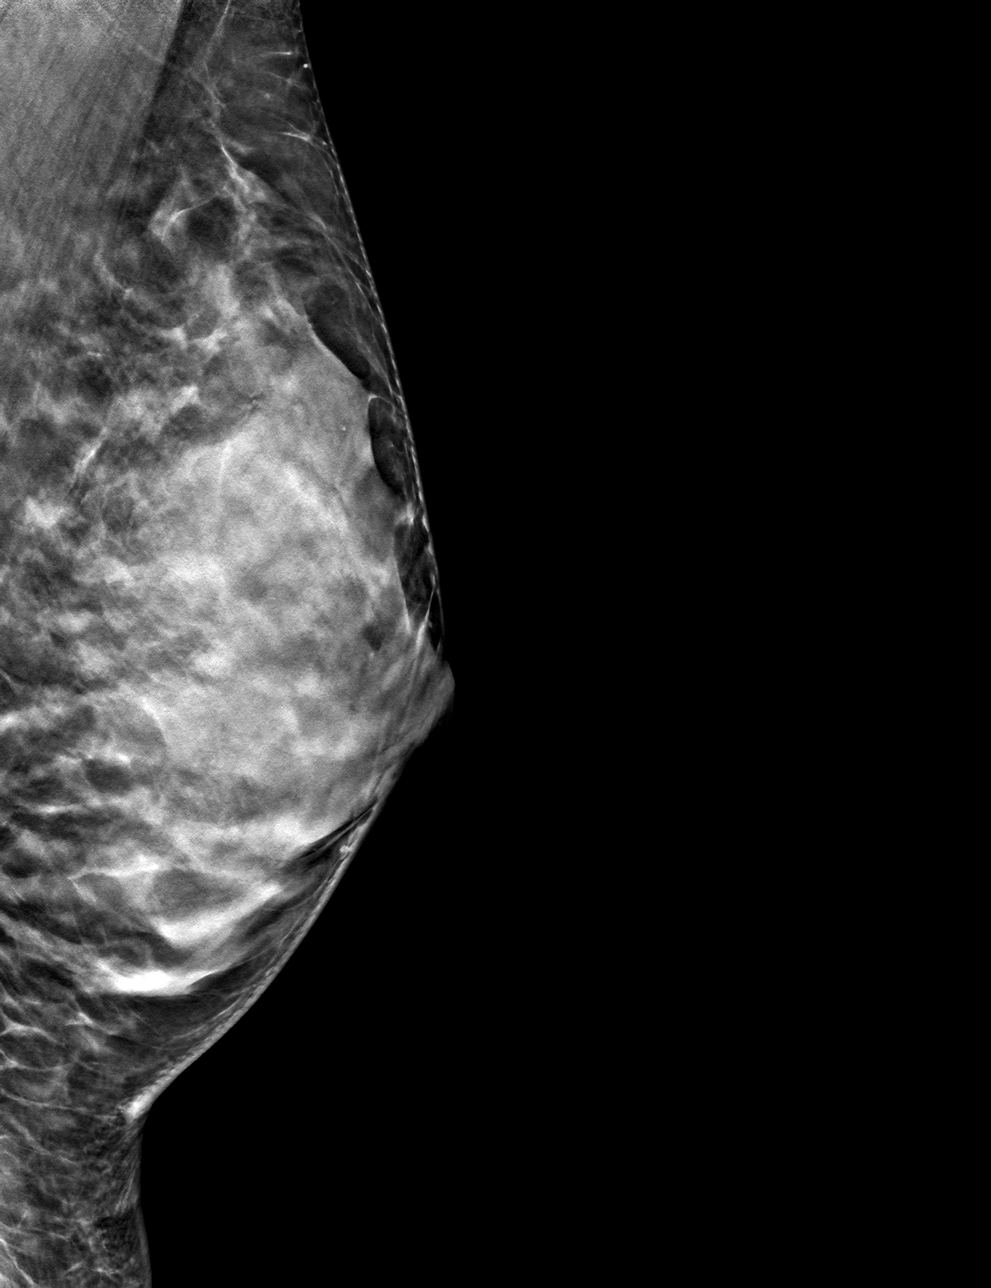

[L MLO tomo · tomo slice 23/46.0]
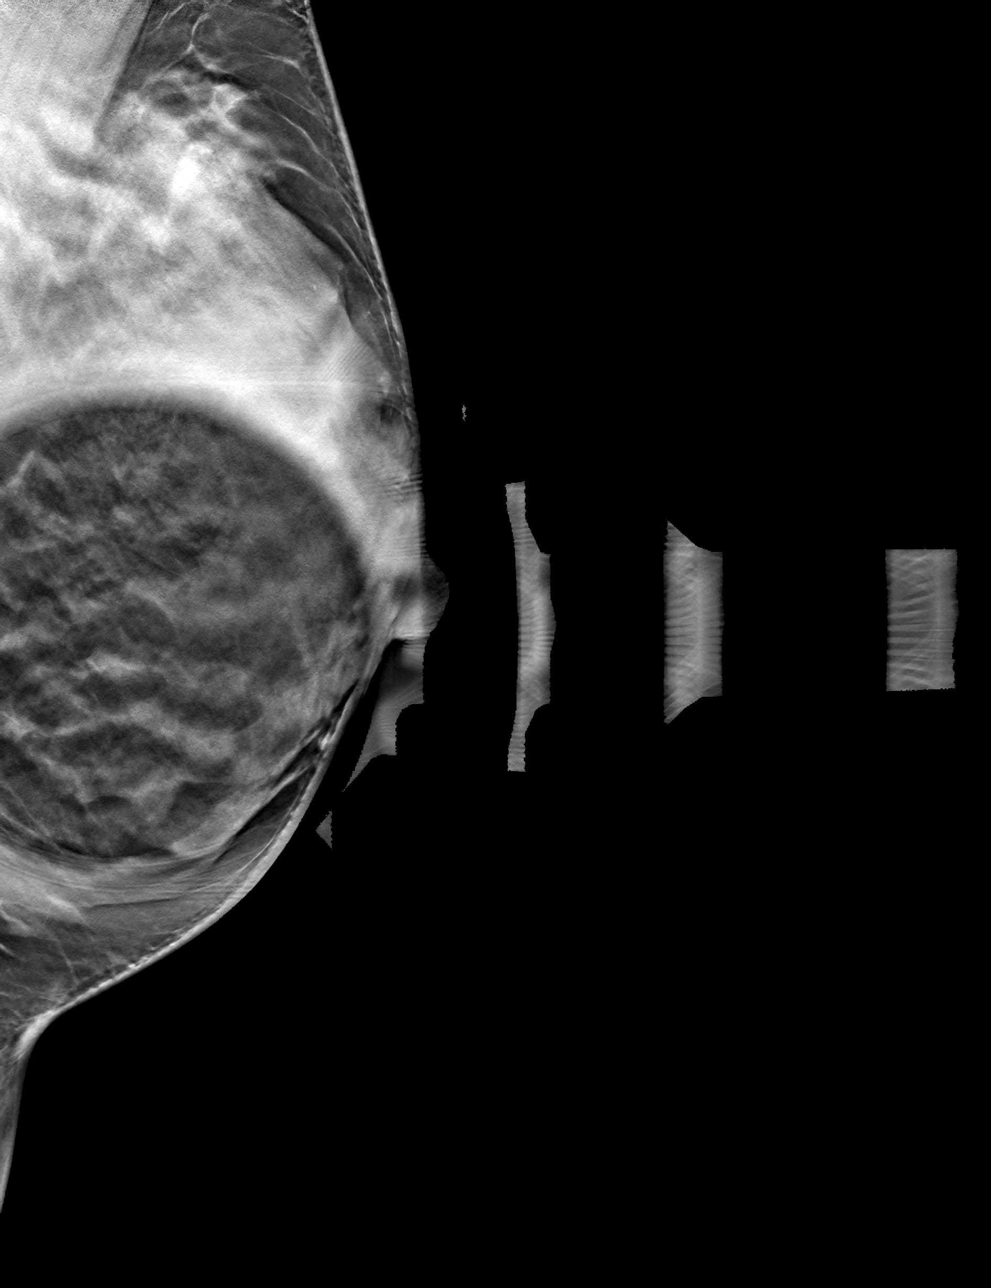

[4 of 12 positions shown; findings below may reference images not displayed]

ACR Breast Density Category d: The breast tissue is extremely dense,
which lowers the sensitivity of mammography.
FINDINGS: Spot compression tomosynthesis images through the inferior left
breast demonstrates no persistent suspicious mammographic findings.
However, due to the density of the patient's breast tissue,
ultrasound will be performed for further evaluation.

Ultrasound targeted to the inferior left breast demonstrates normal
dense fibroglandular tissue. No suspicious masses or areas of
shadowing are identified.
IMPRESSION: There are no persistent mammographic or targeted sonographic
abnormalities in the inferior left breast.

RECOMMENDATION:
Screening mammogram in one year.(Code:[id number])

I have discussed the findings and recommendations with the patient.
If applicable, a reminder letter will be sent to the patient
regarding the next appointment.

BI-RADS CATEGORY  1: Negative.

## 2022-04-20 ENCOUNTER — Other Ambulatory Visit: Payer: Self-pay | Admitting: Family Medicine

## 2022-04-20 DIAGNOSIS — Z1231 Encounter for screening mammogram for malignant neoplasm of breast: Secondary | ICD-10-CM

## 2022-05-27 ENCOUNTER — Ambulatory Visit
Admission: RE | Admit: 2022-05-27 | Discharge: 2022-05-27 | Disposition: A | Discharge status: Home / Self Care | Payer: 59 | Source: Ambulatory Visit | Attending: Family Medicine | Admitting: Family Medicine

## 2022-05-27 DIAGNOSIS — Z1231 Encounter for screening mammogram for malignant neoplasm of breast: Secondary | ICD-10-CM

## 2023-04-07 ENCOUNTER — Other Ambulatory Visit (HOSPITAL_BASED_OUTPATIENT_CLINIC_OR_DEPARTMENT_OTHER): Payer: Self-pay | Admitting: Obstetrics & Gynecology

## 2023-05-24 ENCOUNTER — Other Ambulatory Visit: Payer: Self-pay | Admitting: Family Medicine

## 2023-05-24 DIAGNOSIS — Z1231 Encounter for screening mammogram for malignant neoplasm of breast: Secondary | ICD-10-CM

## 2023-06-16 ENCOUNTER — Ambulatory Visit
Admission: RE | Admit: 2023-06-16 | Discharge: 2023-06-16 | Disposition: A | Discharge status: Home / Self Care | Payer: 59 | Source: Ambulatory Visit | Attending: Family Medicine | Admitting: Family Medicine

## 2023-06-16 DIAGNOSIS — Z1231 Encounter for screening mammogram for malignant neoplasm of breast: Secondary | ICD-10-CM

## 2024-05-31 ENCOUNTER — Other Ambulatory Visit: Payer: Self-pay | Admitting: Family Medicine

## 2024-05-31 DIAGNOSIS — Z1231 Encounter for screening mammogram for malignant neoplasm of breast: Secondary | ICD-10-CM

## 2024-06-21 ENCOUNTER — Ambulatory Visit
Admission: RE | Admit: 2024-06-21 | Discharge: 2024-06-21 | Disposition: A | Discharge status: Home / Self Care | Source: Ambulatory Visit | Attending: Family Medicine | Admitting: Family Medicine

## 2024-06-21 DIAGNOSIS — Z1231 Encounter for screening mammogram for malignant neoplasm of breast: Secondary | ICD-10-CM

## 2024-06-24 ENCOUNTER — Other Ambulatory Visit: Payer: Self-pay | Admitting: Medical Genetics

## 2024-08-26 ENCOUNTER — Other Ambulatory Visit: Payer: Self-pay | Admitting: Medical Genetics

## 2024-08-26 DIAGNOSIS — Z006 Encounter for examination for normal comparison and control in clinical research program: Secondary | ICD-10-CM

## 2024-09-13 ENCOUNTER — Other Ambulatory Visit (HOSPITAL_COMMUNITY): Payer: Self-pay

## 2024-09-15 LAB — GENECONNECT MOLECULAR SCREEN: Genetic Analysis Overall Interpretation: NEGATIVE
